# Patient Record
Sex: Male | Born: 1990 | Race: White | Hispanic: No | Marital: Single | Smoking: Never smoker
Health system: Southern US, Community
[De-identification: ages and names within clinical notes are randomized; demographics above are authoritative.]

## PROBLEM LIST (undated history)

## (undated) HISTORY — PX: APPENDECTOMY: SHX54

---
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
## 2015-03-14 ENCOUNTER — Emergency Department (HOSPITAL_COMMUNITY)
Admission: EM | Admit: 2015-03-14 | Discharge: 2015-03-14 | Disposition: A | Discharge status: Home / Self Care | Payer: No Typology Code available for payment source | Attending: Emergency Medicine | Admitting: Emergency Medicine

## 2015-03-14 ENCOUNTER — Emergency Department (HOSPITAL_COMMUNITY): Payer: No Typology Code available for payment source

## 2015-03-14 ENCOUNTER — Encounter (HOSPITAL_COMMUNITY): Payer: Self-pay | Admitting: Emergency Medicine

## 2015-03-14 DIAGNOSIS — S0083XA Contusion of other part of head, initial encounter: Secondary | ICD-10-CM | POA: Diagnosis not present

## 2015-03-14 DIAGNOSIS — Y92410 Unspecified street and highway as the place of occurrence of the external cause: Secondary | ICD-10-CM | POA: Insufficient documentation

## 2015-03-14 DIAGNOSIS — S0990XA Unspecified injury of head, initial encounter: Secondary | ICD-10-CM | POA: Diagnosis present

## 2015-03-14 DIAGNOSIS — Y9389 Activity, other specified: Secondary | ICD-10-CM | POA: Insufficient documentation

## 2015-03-14 DIAGNOSIS — S79912A Unspecified injury of left hip, initial encounter: Secondary | ICD-10-CM | POA: Diagnosis not present

## 2015-03-14 DIAGNOSIS — S6992XA Unspecified injury of left wrist, hand and finger(s), initial encounter: Secondary | ICD-10-CM | POA: Diagnosis not present

## 2015-03-14 DIAGNOSIS — Y998 Other external cause status: Secondary | ICD-10-CM | POA: Diagnosis not present

## 2015-03-14 DIAGNOSIS — T148XXA Other injury of unspecified body region, initial encounter: Secondary | ICD-10-CM

## 2015-03-14 DIAGNOSIS — S79911A Unspecified injury of right hip, initial encounter: Secondary | ICD-10-CM | POA: Insufficient documentation

## 2015-03-14 MED ORDER — IBUPROFEN 200 MG PO TABS
400.0000 mg | ORAL_TABLET | Freq: Once | ORAL | Status: AC
  Administered 2015-03-14: 400 mg via ORAL
  Filled 2015-03-14: qty 2

## 2015-03-14 NOTE — Discharge Instructions (Signed)
Contusion °A contusion is a deep bruise. Contusions happen when an injury causes bleeding under the skin. Signs of bruising include pain, puffiness (swelling), and discolored skin. The contusion may turn blue, purple, or yellow. °HOME CARE  °· Put ice on the injured area. °¨ Put ice in a plastic bag. °¨ Place a towel between your skin and the bag. °¨ Leave the ice on for 15-20 minutes, 03-04 times a day. °· Only take medicine as told by your doctor. °· Rest the injured area. °· If possible, raise (elevate) the injured area to lessen puffiness. °GET HELP RIGHT AWAY IF:  °· You have more bruising or puffiness. °· You have pain that is getting worse. °· Your puffiness or pain is not helped by medicine. °MAKE SURE YOU:  °· Understand these instructions. °· Will watch your condition. °· Will get help right away if you are not doing well or get worse. °Document Released: 04/04/2008 Document Revised: 01/09/2012 Document Reviewed: 08/22/2011 °[employer]® Patient Information ©2015 [employer]. This information is not intended to replace advice given to you by your health care provider. Make sure you discuss any questions you have with your health care provider. ° °
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
## 2015-03-14 NOTE — ED Notes (Signed)
Patient transported to X-ray 
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
